# Patient Record
Sex: Male | Born: 1955 | Race: White | Hispanic: No | Marital: Married
Health system: Southern US, Community
[De-identification: ages and names within clinical notes are randomized; demographics above are authoritative.]

---

## 2019-04-12 DIAGNOSIS — N400 Enlarged prostate without lower urinary tract symptoms: Secondary | ICD-10-CM | POA: Insufficient documentation

## 2019-04-12 DIAGNOSIS — I10 Essential (primary) hypertension: Secondary | ICD-10-CM | POA: Insufficient documentation

## 2019-04-12 DIAGNOSIS — G8929 Other chronic pain: Secondary | ICD-10-CM | POA: Insufficient documentation

## 2019-04-12 DIAGNOSIS — E559 Vitamin D deficiency, unspecified: Secondary | ICD-10-CM | POA: Insufficient documentation

## 2019-05-01 DIAGNOSIS — M1990 Unspecified osteoarthritis, unspecified site: Secondary | ICD-10-CM | POA: Insufficient documentation

## 2019-05-01 DIAGNOSIS — E782 Mixed hyperlipidemia: Secondary | ICD-10-CM | POA: Insufficient documentation

## 2019-05-01 DIAGNOSIS — K219 Gastro-esophageal reflux disease without esophagitis: Secondary | ICD-10-CM | POA: Insufficient documentation

## 2019-05-01 DIAGNOSIS — D649 Anemia, unspecified: Secondary | ICD-10-CM | POA: Insufficient documentation

## 2019-05-01 DIAGNOSIS — N529 Male erectile dysfunction, unspecified: Secondary | ICD-10-CM | POA: Insufficient documentation

## 2019-05-01 DIAGNOSIS — F419 Anxiety disorder, unspecified: Secondary | ICD-10-CM | POA: Insufficient documentation

## 2019-06-07 DIAGNOSIS — M5416 Radiculopathy, lumbar region: Secondary | ICD-10-CM | POA: Insufficient documentation

## 2019-06-07 DIAGNOSIS — M5136 Other intervertebral disc degeneration, lumbar region: Secondary | ICD-10-CM | POA: Insufficient documentation

## 2020-11-28 DIAGNOSIS — G4700 Insomnia, unspecified: Secondary | ICD-10-CM | POA: Insufficient documentation

## 2020-11-28 DIAGNOSIS — R351 Nocturia: Secondary | ICD-10-CM | POA: Insufficient documentation

## 2021-08-31 DIAGNOSIS — R609 Edema, unspecified: Secondary | ICD-10-CM | POA: Insufficient documentation

## 2022-01-26 ENCOUNTER — Other Ambulatory Visit: Payer: Self-pay | Admitting: Orthopaedic Surgery

## 2022-01-26 DIAGNOSIS — M4716 Other spondylosis with myelopathy, lumbar region: Secondary | ICD-10-CM

## 2022-01-26 DIAGNOSIS — M48062 Spinal stenosis, lumbar region with neurogenic claudication: Secondary | ICD-10-CM

## 2022-02-08 ENCOUNTER — Ambulatory Visit
Admission: RE | Admit: 2022-02-08 | Discharge: 2022-02-08 | Disposition: A | Discharge status: Home / Self Care | Payer: Self-pay | Source: Ambulatory Visit | Attending: Orthopaedic Surgery | Admitting: Orthopaedic Surgery

## 2022-02-08 DIAGNOSIS — M48062 Spinal stenosis, lumbar region with neurogenic claudication: Secondary | ICD-10-CM

## 2022-02-08 DIAGNOSIS — M4716 Other spondylosis with myelopathy, lumbar region: Secondary | ICD-10-CM

## 2022-02-08 MED ORDER — GADOBENATE DIMEGLUMINE 529 MG/ML IV SOLN
20.0000 mL | Freq: Once | INTRAVENOUS | Status: AC | PRN
  Administered 2022-02-08: 20 mL via INTRAVENOUS

## 2022-04-01 DIAGNOSIS — R1032 Left lower quadrant pain: Secondary | ICD-10-CM | POA: Insufficient documentation

## 2022-08-05 ENCOUNTER — Ambulatory Visit: Payer: Medicare Other | Admitting: Neurology

## 2022-08-05 ENCOUNTER — Encounter: Payer: Self-pay | Admitting: Neurology

## 2022-08-05 VITALS — BP 164/78 | HR 64 | Ht 74.0 in

## 2022-08-05 DIAGNOSIS — M47816 Spondylosis without myelopathy or radiculopathy, lumbar region: Secondary | ICD-10-CM | POA: Insufficient documentation

## 2022-08-05 DIAGNOSIS — G5139 Clonic hemifacial spasm, unspecified: Secondary | ICD-10-CM | POA: Diagnosis not present

## 2022-08-05 DIAGNOSIS — G4733 Obstructive sleep apnea (adult) (pediatric): Secondary | ICD-10-CM | POA: Diagnosis not present

## 2022-08-05 DIAGNOSIS — R2689 Other abnormalities of gait and mobility: Secondary | ICD-10-CM | POA: Insufficient documentation

## 2022-08-05 DIAGNOSIS — M545 Low back pain, unspecified: Secondary | ICD-10-CM | POA: Insufficient documentation

## 2022-08-05 NOTE — Progress Notes (Signed)
Chief Complaint  Patient presents with   New Patient (Initial Visit)    RM 62, With wife  NP/Paper/Robert Unk Lightning MD [place] [phone] /Facial twitching States twitching comes and goes       ASSESSMENT AND PLAN  Nathan Lawrence is a 66 y.o. male   Left hemifacial spasm,  Intermittent, not affecting his function,  Decided to continue observe his symptoms,  If getting worse, he may contact our office, consider xeomin injection, MRI of the brain  At risk for obstructive sleep apnea  Obesity, narrow long pharyngeal space, poor sleep quality, frequent awakening,   Referred for sleep study   DIAGNOSTIC DATA (LABS, IMAGING, TESTING) - I reviewed patient records, labs, notes, testing and imaging myself where available.   MEDICAL HISTORY:  Nathan Lawrence is a 66 year old male, seen in request by his primary care physician Dr. Unk Lightning, Audree Camel, for evaluation of left lower face twitching, accompanied by his wife at today's clinical visit August 05, 2022  I reviewed and summarized the referring note. PMHx. HTN Chronic low back pain,  Left knee replacement Kidney stone  He went through difficult few months, since spring 2023, he had kidney stone procedure, low back decompression surgery, and left knee surgery  Since July 2023, he began to notice intermittent left cheek muscle twitching, starting from left upper lip, lasting for few seconds, stop and recover and again, no limitation in his daily function, no dysarthria no aphasia no sensory loss, no visual or hearing change  Laboratory evaluation in 2023, BMP showed normal creatinine 1.13, CBC hemoglobin of 12.9, A1c of 5.5  In addition, he has been dealing with his obesity, poor sleep quality, he contributed to his low back pain, now he is sleeping in recliner, realized he he has an episode of awakening in the middle of the night, before that while he was sleep in bed, he often woke up in the middle  of the night catching breath, complains of daytime fatigue and sleepiness  PHYSICAL EXAM:   Vitals:   08/05/22 1448  BP: (!) 164/78  Pulse: 64  Height: 6\' 2"  (1.88 m)   Not recorded     There is no height or weight on file to calculate BMI.  PHYSICAL EXAMNIATION:  Gen: NAD, conversant, well nourised, well groomed                     Cardiovascular: Regular rate rhythm, no peripheral edema, warm, nontender. Eyes: Conjunctivae clear without exudates or hemorrhage Neck: Supple, no carotid bruits. Pulmonary: Clear to auscultation bilaterally   NEUROLOGICAL EXAM:  MENTAL STATUS: Speech/cognition: Awake, alert, oriented to history taking and casual conversation CRANIAL NERVES: CN II: Visual fields are full to confrontation. Pupils are round equal and briskly reactive to light.   CN III, IV, VI: extraocular movement are normal. No ptosis. CN V: Facial sensation is intact to light touch,Bilateral corneal reflexes are present and symmetric CN VII: Face is symmetric with normal eye closure  CN VIII: Hearing is normal to causal conversation. CN IX, X: Phonation is normal. CN XI: Head turning and shoulder shrug are intact CN XII: Narrow Oropharyngeal space  MOTOR: There is no pronator drift of out-stretched arms. Muscle bulk and tone are normal. Muscle strength is normal.  REFLEXES: Reflexes are trace and symmetric at the biceps, triceps, knees, and ankles. Plantar responses are flexor.  SENSORY: Intact to light touch,  COORDINATION: There is no trunk or limb dysmetria  noted.  GAIT/STANCE: Need push-up to get up from seated position, limp due to left knee pain  REVIEW OF SYSTEMS:  Full 14 system review of systems performed and notable only for as above All other review of systems were negative.   ALLERGIES: No Known Allergies  HOME MEDICATIONS: Current Outpatient Medications  Medication Sig Dispense Refill   amLODipine (NORVASC) 10 MG tablet Take 10 mg by mouth  daily.     Cyanocobalamin (VITAMIN B 12 PO) Take by mouth.     hydrochlorothiazide (MICROZIDE) 12.5 MG capsule Take 12.5 mg by mouth daily.     meloxicam (MOBIC) 15 MG tablet Take 15 mg by mouth daily.     olmesartan (BENICAR) 40 MG tablet Take 40 mg by mouth daily.     pregabalin (LYRICA) 75 MG capsule Take 75 mg by mouth 2 (two) times daily.     tamsulosin (FLOMAX) 0.4 MG CAPS capsule Take 1 capsule by mouth daily.     Vitamin D, Ergocalciferol, (DRISDOL) 1.25 MG (50000 UNIT) CAPS capsule Take 50,000 Units by mouth once a week.     No current facility-administered medications for this visit.    PAST MEDICAL HISTORY: No past medical history on file.  PAST SURGICAL HISTORY: Lumbar decompression Left knee replacement  FAMILY HISTORY: No family history on file.  SOCIAL HISTORY: Social History   Socioeconomic History   Marital status: Married    Spouse name: Not on file   Number of children: Not on file   Years of education: Not on file   Highest education level: Not on file  Occupational History   Not on file  Tobacco Use   Smoking status: Not on file   Smokeless tobacco: Not on file  Substance and Sexual Activity   Alcohol use: Not on file   Drug use: Not on file   Sexual activity: Not on file  Other Topics Concern   Not on file  Social History Narrative   Not on file   Social Determinants of Health   Financial Resource Strain: Not on file  Food Insecurity: Not on file  Transportation Needs: Not on file  Physical Activity: Not on file  Stress: Not on file  Social Connections: Not on file  Intimate Partner Violence: Not on file      Levert Feinstein, M.D. Ph.D.  [facility] [address] [place], [place] [address] Ph: [phone] Fax: [phone]  CC:  Hadley Pen, MD [address] [place],  [place] [address]  Pcp, No

## 2023-07-06 IMAGING — MR MR LUMBAR SPINE WO/W CM
4 of 7 series · 23 of 48 positions shown · IV contrast (20 ml Multihance)
Comparison: Lumbar spine MRI 07/12/2020

CLINICAL DATA: Low back pain for 6 years, left leg pain. History of
surgery in 5995

EXAM:
MRI LUMBAR SPINE WITHOUT AND WITH CONTRAST
TECHNIQUE: Multiplanar and multiecho pulse sequences of the lumbar spine were
obtained without and with intravenous contrast.
CONTRAST:  20mL MULTIHANCE GADOBENATE DIMEGLUMINE 529 MG/ML IV SOLN

[Series 3: T1 · sagittal · 4.0mm · 0.55mm/px · 5 of 17 slices shown (1 of 2)]
[im 1/17]
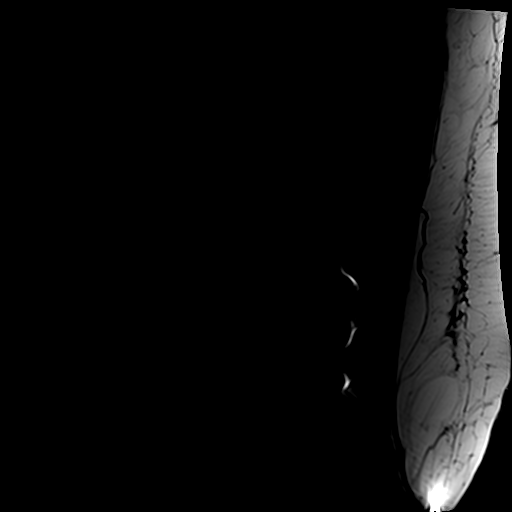
[im 5/17]
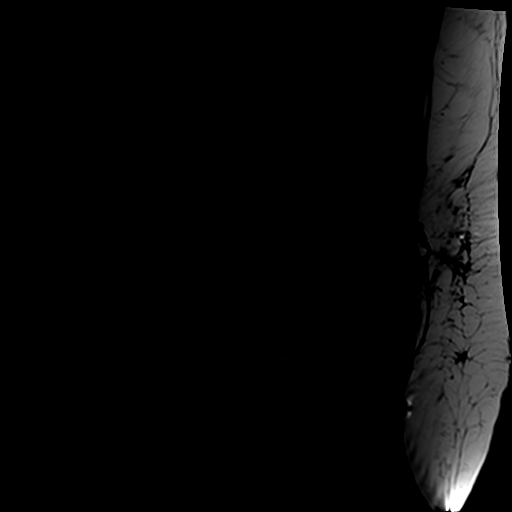
[im 9/17]
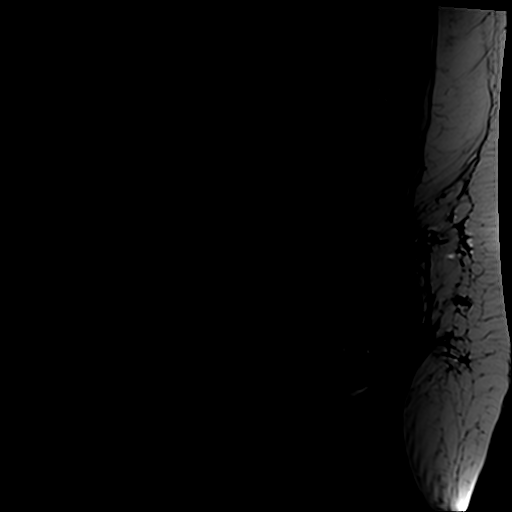
[im 13/17]
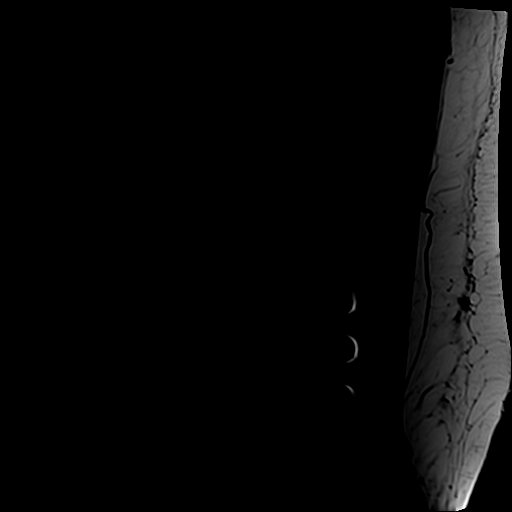
[im 17/17]
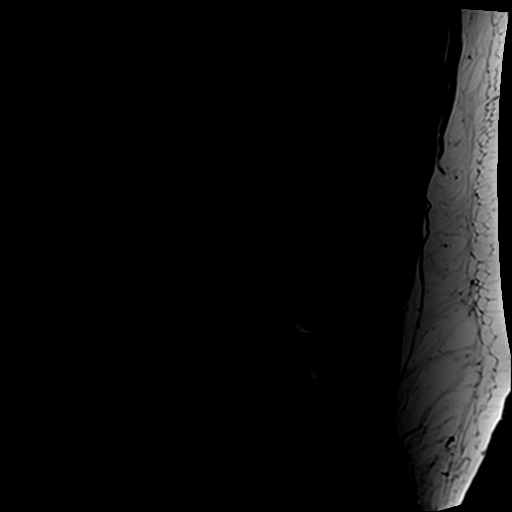

[Series 5: T2 · axial · 4.0mm · 0.70mm/px · z∈[-144,+84]mm · 8 of 42 slices shown (1 of 2)]
[im 1/42]
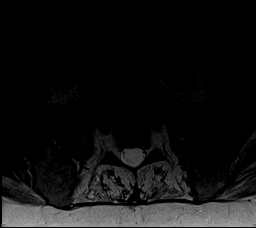
[im 5/42]
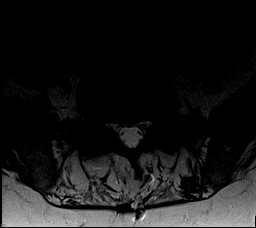
[im 14/42]
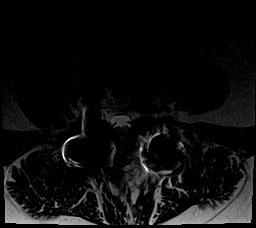
[im 19/42]
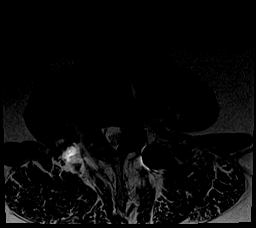
[im 23/42]
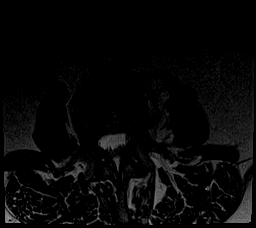
[im 28/42]
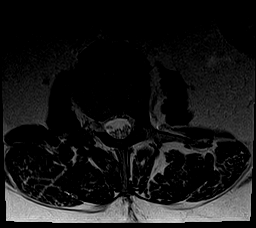
[im 37/42]
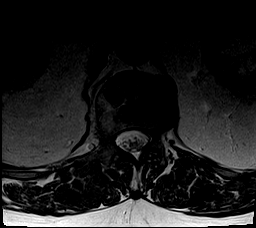
[im 42/42]
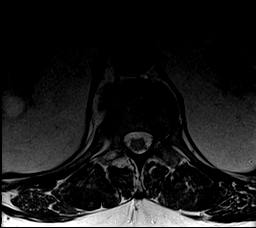

[Series 6: T1 · axial · 4.0mm · 0.35mm/px · z∈[-144,+58]mm · 6 of 42 slices shown (2 of 2)]
[im 1/42]
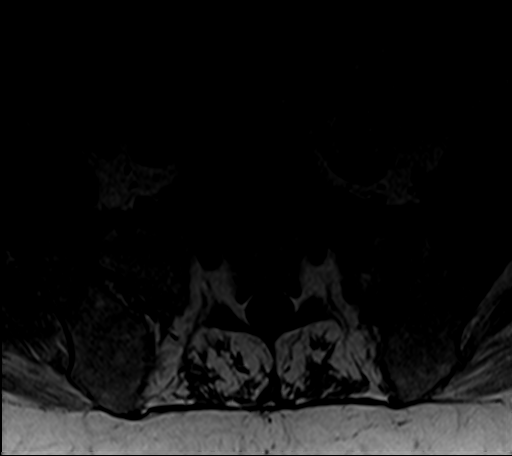
[im 5/42]
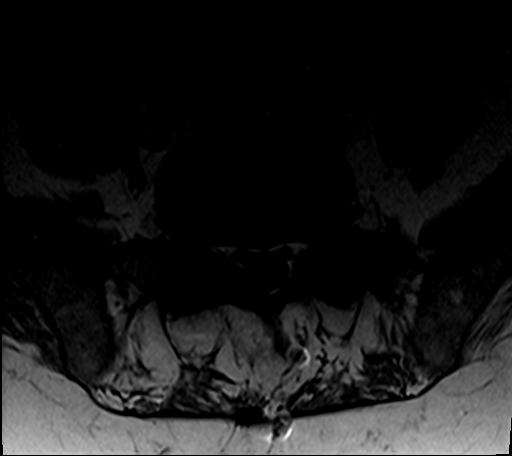
[im 14/42]
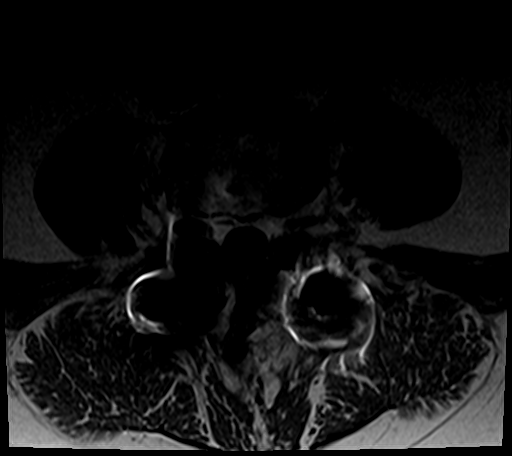
[im 19/42]
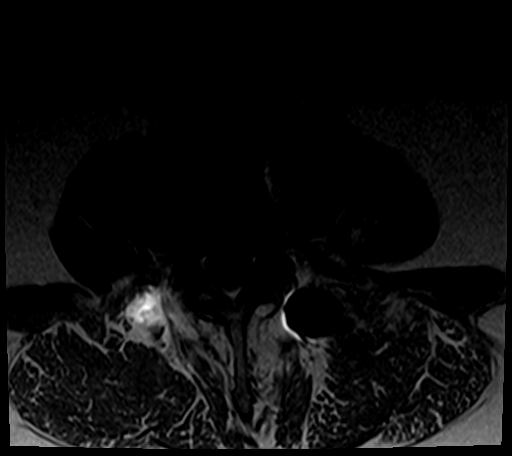
[im 23/42]
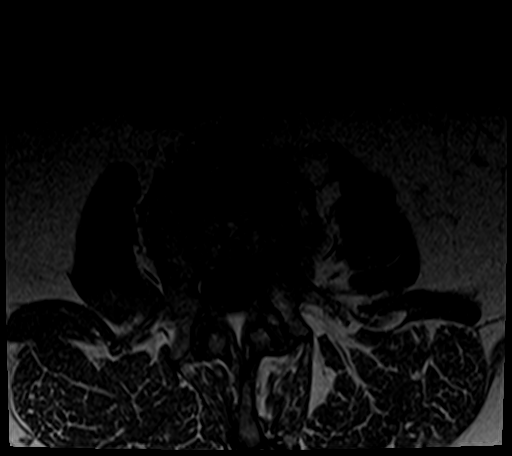
[im 37/42]
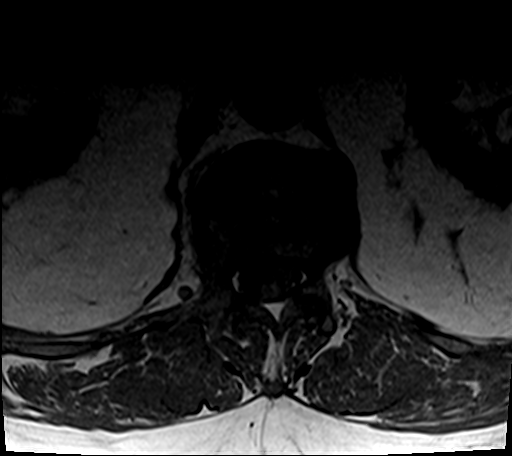

[Series 7: T2 · sagittal · 4.0mm · 0.55mm/px · 4 of 17 slices shown (2 of 2)]
[im 1/17]
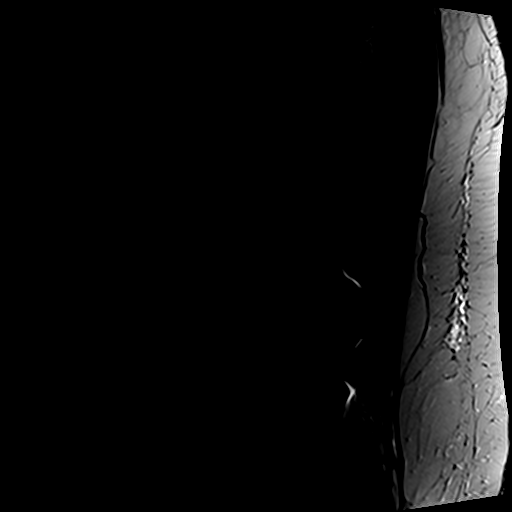
[im 6/17]
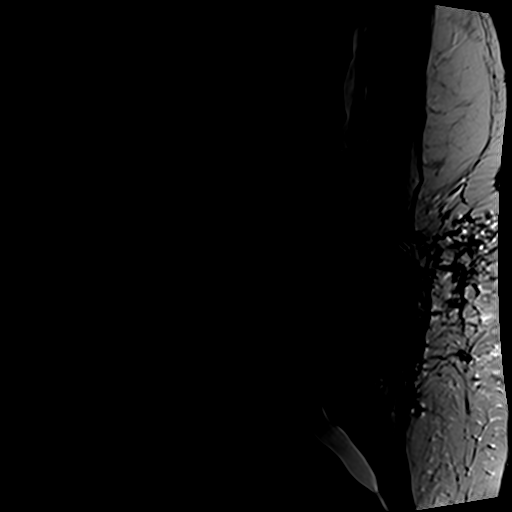
[im 11/17]
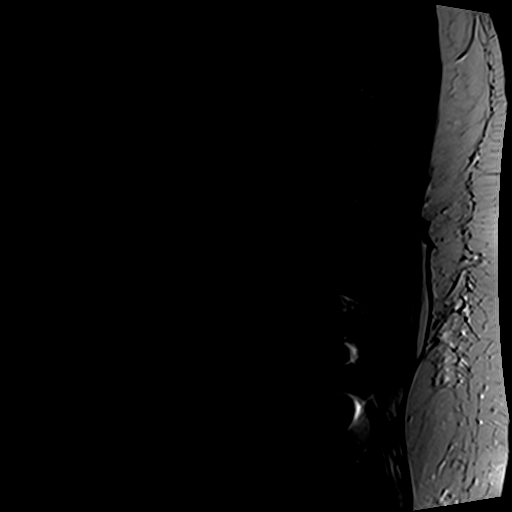
[im 17/17]
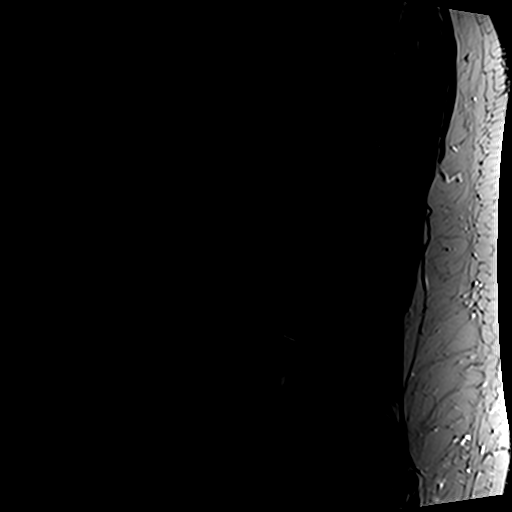

[23 of 48 positions shown; findings below may reference images not displayed]

FINDINGS: Segmentation: Standard; the lowest formed disc space is designated
L5-S1.

Alignment: There is dextrocurvature centered at L3, similar to the
prior study. There is trace retrolisthesis of L1 on L2, L2 on L3,
and L3 on L4, not significantly changed.

Vertebrae: The patient is status post posterior instrumented fusion
at L4-L5. There is no evidence of complication. Background marrow
signal is normal. An intraosseous hemangioma in the L1 vertebral
body is unchanged. There is degenerative endplate marrow signal
abnormality with faint edema and enhancement at T12-L1, new since
1610. There is no suspicious marrow signal abnormality or evidence
of acute injury.

Conus medullaris and cauda equina: Conus extends to the L1 level.
Conus and cauda equina appear normal.

Paraspinal and other soft tissues: T2 hyperintense renal lesions
likely reflects cysts, for which no specific imaging follow-up is
required.

Disc levels:

There is multilevel disc desiccation and narrowing throughout the
nonsurgical levels, most advanced at T12-L1, slightly progressed
since 1610.

T12-L1: There is a mild disc bulge and left worse than right facet
arthropathy resulting in mild right and no significant left neural
foraminal stenosis and no significant spinal canal stenosis, similar
to the prior study

L1-L2: There is a mild disc bulge and mild degenerative endplate
change and bilateral facet arthropathy superimposed on scoliotic
curvature resulting in mild left and no significant right neural
foraminal stenosis and no significant spinal canal stenosis, not
significantly changed

L2-L3: There is a disc bulge with a left foraminal component,
degenerative endplate change, and bilateral facet arthropathy
resulting in mild narrowing of the left subarticular zone with
possible irritation of the traversing left L3 nerve root and
moderate left and mild right neural foraminal stenosis, overall not
significantly changed.

L3-L4: There is a mild disc bulge, degenerative endplate change, and
bilateral facet arthropathy resulting in mild spinal canal stenosis
with narrowing of the left subarticular zone and possible irritation
of the traversing left L4 nerve root and moderate to severe left and
moderate right neural foraminal stenosis. The foraminal stenosis may
be slightly worsened since 1610

L4-L5: Status post posterior instrumented fusion and decompression.
There is a mild disc bulge and bilateral facet arthropathy without
significant spinal canal or neural foraminal stenosis

L5-S1: There is a mild disc bulge with a right
foraminal/extraforaminal protrusion and bilateral facet arthropathy
resulting in mild narrowing of the subarticular zones, right more
than left, without evidence of frank nerve root impingement. There
is moderate right and mild left neural foraminal stenosis with
possible irritation of the extraforaminal right L5 nerve root by
disc material which appears new/increased since [DATE]).
IMPRESSION: 1. Status post posterior instrumented fusion and decompression at
L4-L5 without evidence of complication. There is a mild disc bulge
and bilateral facet arthropathy this level without significant
spinal canal or neural foraminal stenosis.
2. Adjacent segment disease at L3-L4 including grade 1
retrolisthesis resulting in narrowing of the left subarticular zone
with possible irritation of the traversing left L4 nerve root and
moderate to severe left and moderate right neural foraminal
stenosis. The foraminal stenosis appears slightly worsened since
[DATE]. Disc degeneration with associated marrow signal abnormality and
faint edema at T12-L1 has progressed since 1610. Mild right neural
foraminal stenosis at this level.
4. Disc bulge with a right foraminal/extraforaminal protrusion at
L5-S1 which may irritate the extraforaminal right L5 nerve root,
new/increased since 1610. Moderate left neural foraminal stenosis at
this level is unchanged.
5. At L2-L3, there is left subarticular zone narrowing with possible
irritation of the traversing left L3 nerve root, and moderate left
neural foraminal stenosis, not significantly changed.
6. Multilevel grade 1 retrolisthesis and dextrocurvature centered at
L3, not significantly changed.

## 2024-01-11 ENCOUNTER — Other Ambulatory Visit: Payer: Self-pay | Admitting: Rehabilitation

## 2024-01-11 DIAGNOSIS — M5416 Radiculopathy, lumbar region: Secondary | ICD-10-CM

## 2024-01-11 DIAGNOSIS — M4326 Fusion of spine, lumbar region: Secondary | ICD-10-CM

## 2024-01-28 ENCOUNTER — Other Ambulatory Visit

## 2024-02-03 ENCOUNTER — Ambulatory Visit
Admission: RE | Admit: 2024-02-03 | Discharge: 2024-02-03 | Disposition: A | Discharge status: Home / Self Care | Source: Ambulatory Visit | Attending: Rehabilitation | Admitting: Rehabilitation

## 2024-02-03 DIAGNOSIS — M5416 Radiculopathy, lumbar region: Secondary | ICD-10-CM

## 2024-02-03 DIAGNOSIS — M4326 Fusion of spine, lumbar region: Secondary | ICD-10-CM

## 2024-02-03 MED ORDER — GADOPICLENOL 0.5 MMOL/ML IV SOLN
10.0000 mL | Freq: Once | INTRAVENOUS | Status: AC | PRN
  Administered 2024-02-03: 10 mL via INTRAVENOUS
